# Patient Record
Sex: Male | Born: 1975 | Race: Black or African American | Hispanic: No | Marital: Single
Health system: Southern US, Community
[De-identification: ages and names within clinical notes are randomized; demographics above are authoritative.]

---

## 2002-01-05 ENCOUNTER — Emergency Department (HOSPITAL_COMMUNITY): Admission: EM | Admit: 2002-01-05 | Discharge: 2002-01-05 | Payer: Self-pay | Admitting: Emergency Medicine

## 2004-02-06 ENCOUNTER — Emergency Department (HOSPITAL_COMMUNITY): Admission: EM | Admit: 2004-02-06 | Discharge: 2004-02-06 | Payer: Self-pay | Admitting: Emergency Medicine

## 2014-12-06 ENCOUNTER — Encounter (HOSPITAL_COMMUNITY): Payer: Self-pay | Admitting: Emergency Medicine

## 2014-12-06 ENCOUNTER — Emergency Department (HOSPITAL_COMMUNITY)
Admission: EM | Admit: 2014-12-06 | Discharge: 2014-12-07 | Disposition: A | Discharge status: Home / Self Care | Payer: Self-pay | Attending: Emergency Medicine | Admitting: Emergency Medicine

## 2014-12-06 DIAGNOSIS — S0181XA Laceration without foreign body of other part of head, initial encounter: Secondary | ICD-10-CM

## 2014-12-06 DIAGNOSIS — Z720 Tobacco use: Secondary | ICD-10-CM | POA: Insufficient documentation

## 2014-12-06 DIAGNOSIS — S01411A Laceration without foreign body of right cheek and temporomandibular area, initial encounter: Secondary | ICD-10-CM | POA: Insufficient documentation

## 2014-12-06 DIAGNOSIS — S01111A Laceration without foreign body of right eyelid and periocular area, initial encounter: Secondary | ICD-10-CM | POA: Insufficient documentation

## 2014-12-06 DIAGNOSIS — Y998 Other external cause status: Secondary | ICD-10-CM | POA: Insufficient documentation

## 2014-12-06 DIAGNOSIS — S0990XA Unspecified injury of head, initial encounter: Secondary | ICD-10-CM | POA: Insufficient documentation

## 2014-12-06 DIAGNOSIS — Z23 Encounter for immunization: Secondary | ICD-10-CM | POA: Insufficient documentation

## 2014-12-06 DIAGNOSIS — Y929 Unspecified place or not applicable: Secondary | ICD-10-CM | POA: Insufficient documentation

## 2014-12-06 DIAGNOSIS — Y9389 Activity, other specified: Secondary | ICD-10-CM | POA: Insufficient documentation

## 2014-12-06 DIAGNOSIS — H539 Unspecified visual disturbance: Secondary | ICD-10-CM | POA: Insufficient documentation

## 2014-12-06 MED ORDER — LIDOCAINE-EPINEPHRINE 1 %-1:100000 IJ SOLN
10.0000 mL | Freq: Once | INTRAMUSCULAR | Status: AC
  Administered 2014-12-06: 1 mL
  Filled 2014-12-06: qty 1

## 2014-12-06 MED ORDER — TETANUS-DIPHTH-ACELL PERTUSSIS 5-2.5-18.5 LF-MCG/0.5 IM SUSP
0.5000 mL | Freq: Once | INTRAMUSCULAR | Status: AC
  Administered 2014-12-06: 0.5 mL via INTRAMUSCULAR
  Filled 2014-12-06: qty 0.5

## 2014-12-06 MED ORDER — IBUPROFEN 800 MG PO TABS
800.0000 mg | ORAL_TABLET | Freq: Once | ORAL | Status: AC
  Administered 2014-12-06: 800 mg via ORAL
  Filled 2014-12-06: qty 1
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
## 2014-12-07 ENCOUNTER — Emergency Department (HOSPITAL_COMMUNITY): Payer: Self-pay

## 2014-12-07 ENCOUNTER — Encounter (HOSPITAL_COMMUNITY): Payer: Self-pay

## 2014-12-07 MED ORDER — OXYCODONE-ACETAMINOPHEN 5-325 MG PO TABS
1.0000 | ORAL_TABLET | Freq: Once | ORAL | Status: AC
  Administered 2014-12-07: 1 via ORAL
  Filled 2014-12-07: qty 1

## 2014-12-07 MED ORDER — OXYCODONE-ACETAMINOPHEN 5-325 MG PO TABS: 1.0000 | ORAL_TABLET | Freq: Four times a day (QID) | ORAL | Status: DC | PRN
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
## 2014-12-07 NOTE — Discharge Instructions (Signed)
1. Medications: Percocet or ibuprofen for pain, usual home medications 2. Treatment: ice for swelling, keep wound clean with warm soap and water and keep bandage dry, do not submerge in water for 24 hours 3. Follow Up: Dr. Jearld FentonByers in 5 days for evaluation of orbital rim and floor fractures of the face  Facial Fracture A facial fracture is a break in one of the bones of your face. HOME CARE INSTRUCTIONS   Protect the injured part of your face until it is healed.  Do not participate in activities which give chance for re-injury until your doctor approves.  Gently wash and dry your face.  Wear head and facial protection while riding a bicycle, motorcycle, or snowmobile. SEEK MEDICAL CARE IF:   An oral temperature above 102 F (38.9 C) develops.  You have severe headaches or notice changes in your vision.  You have new numbness or tingling in your face.  You develop nausea (feeling sick to your stomach), vomiting or a stiff neck. SEEK IMMEDIATE MEDICAL CARE IF:   You develop difficulty seeing or experience double vision.  You become dizzy, lightheaded, or faint.  You develop trouble speaking, breathing, or swallowing.  You have a watery discharge from your nose or ear. MAKE SURE YOU:   Understand these instructions.  Will watch your condition.  Will get help right away if you are not doing well or get worse. Document Released: 05/21/2005 Document Revised: 08/13/2011 Document Reviewed: 01/08/2008 [facility] Patient Information 2015 [employer] [place]. This information is not intended to replace advice given to you by your health care provider. Make sure you discuss any questions you have with your health care provider.  WOUND CARE  Keep area clean and dry for 24 hours. Do not remove bandage, if applied.  After 24 hours, remove bandage and wash wound gently with mild soap and warm water. Reapply a new bandage after cleaning wound, if directed.   Continue daily cleansing with soap  and water until stitches/staples are removed.  Do not apply any ointments or creams to the wound while stitches/staples are in place, as this may cause delayed healing. Return if you experience any of the following signs of infection: Swelling, redness, pus drainage, streaking, fever >101.0 F  Return if you experience excessive bleeding that does not stop after 15-20 minutes of constant, firm pressure.

## 2016-10-01 ENCOUNTER — Emergency Department (HOSPITAL_COMMUNITY)
Admission: EM | Admit: 2016-10-01 | Discharge: 2016-10-01 | Disposition: A | Discharge status: Home / Self Care | Payer: Self-pay | Attending: Emergency Medicine | Admitting: Emergency Medicine

## 2016-10-01 ENCOUNTER — Encounter (HOSPITAL_COMMUNITY): Payer: Self-pay

## 2016-10-01 DIAGNOSIS — F17200 Nicotine dependence, unspecified, uncomplicated: Secondary | ICD-10-CM | POA: Insufficient documentation

## 2016-10-01 DIAGNOSIS — Z79899 Other long term (current) drug therapy: Secondary | ICD-10-CM | POA: Insufficient documentation

## 2016-10-01 DIAGNOSIS — F19951 Other psychoactive substance use, unspecified with psychoactive substance-induced psychotic disorder with hallucinations: Secondary | ICD-10-CM | POA: Diagnosis present

## 2016-10-01 LAB — RAPID URINE DRUG SCREEN, HOSP PERFORMED
AMPHETAMINES: NOT DETECTED
BENZODIAZEPINES: NOT DETECTED
Barbiturates: NOT DETECTED
Cocaine: POSITIVE — AB
OPIATES: NOT DETECTED
Tetrahydrocannabinol: POSITIVE — AB

## 2016-10-01 LAB — COMPREHENSIVE METABOLIC PANEL
ALBUMIN: 4.2 g/dL (ref 3.5–5.0)
ALT: 16 U/L — ABNORMAL LOW (ref 17–63)
ANION GAP: 10 (ref 5–15)
AST: 25 U/L (ref 15–41)
Alkaline Phosphatase: 59 U/L (ref 38–126)
BUN: 13 mg/dL (ref 6–20)
CO2: 25 mmol/L (ref 22–32)
CREATININE: 1.12 mg/dL (ref 0.61–1.24)
Calcium: 9.3 mg/dL (ref 8.9–10.3)
Chloride: 106 mmol/L (ref 101–111)
GFR calc Af Amer: >60 mL/min (ref >60)
GFR calc non Af Amer: >60 mL/min (ref >60)
Glucose, Bld: 94 mg/dL (ref 65–99)
Potassium: 3.8 mmol/L (ref 3.5–5.1)
SODIUM: 141 mmol/L (ref 135–145)
Total Bilirubin: 0.7 mg/dL (ref 0.3–1.2)
Total Protein: 7.7 g/dL (ref 6.5–8.1)

## 2016-10-01 LAB — CBC WITH DIFFERENTIAL/PLATELET
BASOS ABS: 0 10*3/uL (ref 0.0–0.1)
Basophils Relative: 0 %
EOS ABS: 0.2 10*3/uL (ref 0.0–0.7)
EOS PCT: 2 %
HCT: 42.1 % (ref 39.0–52.0)
Hemoglobin: 14.2 g/dL (ref 13.0–17.0)
LYMPHS PCT: 23 %
Lymphs Abs: 2 10*3/uL (ref 0.7–4.0)
MCH: 32.7 pg (ref 26.0–34.0)
MCHC: 33.7 g/dL (ref 30.0–36.0)
MCV: 97 fL (ref 78.0–100.0)
Monocytes Absolute: 0.8 10*3/uL (ref 0.1–1.0)
Monocytes Relative: 9 %
Neutro Abs: 5.8 10*3/uL (ref 1.7–7.7)
Neutrophils Relative %: 66 %
PLATELETS: 207 10*3/uL (ref 150–400)
RBC: 4.34 MIL/uL (ref 4.22–5.81)
RDW: 13.4 % (ref 11.5–15.5)
WBC: 8.7 10*3/uL (ref 4.0–10.5)

## 2016-10-01 LAB — ETHANOL: Alcohol, Ethyl (B): <5 mg/dL (ref <5)

## 2016-10-01 LAB — SALICYLATE LEVEL: Salicylate Lvl: <7 mg/dL (ref 2.8–30.0)

## 2016-10-01 LAB — ACETAMINOPHEN LEVEL: Acetaminophen (Tylenol), Serum: <10 ug/mL — ABNORMAL LOW (ref 10–30)

## 2016-10-01 MED ORDER — LORAZEPAM 1 MG PO TABS: 1.0000 mg | ORAL_TABLET | Freq: Three times a day (TID) | ORAL | Status: DC | PRN

## 2016-10-01 MED ORDER — ACETAMINOPHEN 325 MG PO TABS: 650.0000 mg | ORAL_TABLET | ORAL | Status: DC | PRN

## 2016-10-01 MED ORDER — IBUPROFEN 200 MG PO TABS: 600.0000 mg | ORAL_TABLET | Freq: Three times a day (TID) | ORAL | Status: DC | PRN

## 2016-10-01 MED ORDER — ONDANSETRON HCL 4 MG PO TABS: 4.0000 mg | ORAL_TABLET | Freq: Three times a day (TID) | ORAL | Status: DC | PRN

## 2016-10-01 MED ORDER — ALUM & MAG HYDROXIDE-SIMETH 200-200-20 MG/5ML PO SUSP: 30.0000 mL | ORAL | Status: DC | PRN

## 2016-10-01 MED ORDER — NICOTINE 21 MG/24HR TD PT24: 21.0000 mg | MEDICATED_PATCH | Freq: Every day | TRANSDERMAL | Status: DC
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
## 2016-10-01 NOTE — ED Notes (Signed)
Night RN attempt to collect lab unable. This writer Attempt twice to collect labs unsuccessful.

## 2016-10-01 NOTE — BH Assessment (Signed)
[facility] Assessment Progress Note  Per Mojeed Akintayo, MD, this pt does not require psychiatric hospitalization at this time.  Pt is to be discharged from [facility] with recommendation to follow up with [facility].  This has been included in pt's discharge instructions.  Pt's nurse has been notified.  Kofi Murrell, MA Triage Specialist [phone]     
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
## 2016-10-01 NOTE — ED Notes (Signed)
TTS in to see the patient.

## 2016-10-01 NOTE — ED Notes (Signed)
I called main phlebotomy for lab draw, they will come draw labs

## 2016-10-01 NOTE — ED Notes (Signed)
Signature pad not working. Patient attempted to sign.
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
## 2016-10-01 NOTE — Discharge Instructions (Signed)
For your ongoing mental health needs, you are advised to follow up with [facility].  New and returning patients are seen at their walk-in clinic.  Walk-in hours are Monday - Friday from 8:00 am - 3:00 pm.  Walk-in patients are seen on a first come, first served basis.  Try to arrive as early as possible for he best chance of being seen the same day: ° °     [facility] °     [address] °     , [place] [address] °     [phone] °

## 2016-10-01 NOTE — ED Provider Notes (Signed)
[facility]-EMERGENCY DEPT Provider Note   CSN: [id number] Arrival date & time: 10/01/16  4098     History   Chief Complaint Chief Complaint  Patient presents with  . Medical Clearance    HPI Marcus Chapman is a 41 y.o. male.  Marcus Chapman is a 41 y.o. Male who presents to the ED complaining of people trying to break into his house and seeing drones watching him. He reports he is "tripping on molly and cocaine." He believes he is seeing these things because of the drugs he is using today. He reports mixing Molly and cocaine today. Police have been out to his home several times apparently. He tells me today he is been very paranoid about some breaking into his house. He also tells me he often sees drones flying overhead. He tells me his brother tells him that they are not there and that it is because he is using drugs. He denies using other illicit drugs. He denies trying to harm himself today. He denies suicidal or homicidal ideations. Denies history of mental illness. He denies physical complaints. He denies fevers, trouble urinating, abdominal pain, nausea, vomiting, diarrhea, chest pain, shortness of breath or rashes.   The history is provided by the patient, the police and medical records. No language interpreter was used.    History reviewed. No pertinent past medical history.  Patient Active Problem List   Diagnosis Date Noted  . 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 No pertinent surgical history.     Home Medications    Prior to Admission medications   Medication Sig Start Date End Date Taking? Authorizing Provider  oxyCODONE-acetaminophen (PERCOCET/ROXICET) 5-325 MG per tablet Take 1-2 tablets by mouth every 6 (six) hours as needed for severe pain. 12/07/14   Shon Baton, MD    Family History History reviewed. No pertinent family history.  Social History Social History  Substance Use Topics  . Smoking  status: Current Every Day Smoker  . Smokeless tobacco: Never Used  . Alcohol use No     Allergies   Patient has no known allergies.   Review of Systems Review of Systems  Constitutional: Negative for chills and fever.  HENT: Negative for congestion and sore throat.   Eyes: Negative for visual disturbance.  Respiratory: Negative for cough and shortness of breath.   Cardiovascular: Negative for chest pain.  Gastrointestinal: Negative for abdominal pain, diarrhea, nausea and vomiting.  Genitourinary: Negative for dysuria.  Musculoskeletal: Negative for back pain.  Skin: Negative for rash.  Neurological: Negative for headaches.  Psychiatric/Behavioral: Positive for hallucinations. Negative for dysphoric mood and suicidal ideas. The patient is nervous/anxious.      Physical Exam Updated Vital Signs BP 118/84 (BP Location: Right Arm)   Pulse 77   Temp 98.2 F (36.8 C) (Oral)   Resp 18   Ht  (1.854 m)   Wt 81.6 kg   SpO2 100%   BMI 23.75 kg/m   Physical Exam  Constitutional: He appears well-developed and well-nourished. No distress.  Nontoxic appearing.  HENT:  Head: Normocephalic and atraumatic.  Mouth/Throat: Oropharynx is clear and moist.  Eyes: Conjunctivae are normal. Pupils are equal, round, and reactive to light. Right eye exhibits no discharge. Left eye exhibits no discharge.  Neck: Neck supple.  Cardiovascular: Normal rate, regular rhythm, normal heart sounds and intact distal pulses.   Pulmonary/Chest: Effort normal and breath sounds normal. No respiratory distress.  Abdominal: Soft. There is no tenderness.  Musculoskeletal:  Patient is spontaneously moving all extremities in a coordinated fashion exhibiting good strength.   Lymphadenopathy:    He has no cervical adenopathy.  Neurological: He is alert. Coordination normal.  Skin: Skin is warm and dry. No rash noted. He is not diaphoretic. No erythema. No pallor.  Psychiatric: His speech is normal. His  mood appears anxious. Thought content is paranoid. He expresses no homicidal and no suicidal ideation.  Patient appears slightly anxious and is paranoid. He endorses seeing drones who are watching him as well as people trying to break into his house despite police and his brother telling him this is false. He denies SI or HI. He is cooperative.   Nursing note and vitals reviewed.    ED Treatments / Results  Labs (all labs ordered are listed, but only abnormal results are displayed) Labs Reviewed  COMPREHENSIVE METABOLIC PANEL - Abnormal; Notable for the following:       Result Value   ALT 16 (*)    All other components within normal limits  RAPID URINE DRUG SCREEN, HOSP PERFORMED - Abnormal; Notable for the following:    Cocaine POSITIVE (*)    Tetrahydrocannabinol POSITIVE (*)    All other components within normal limits  ACETAMINOPHEN LEVEL - Abnormal; Notable for the following:    Acetaminophen (Tylenol), Serum <10 (*)    All other components within normal limits  ETHANOL  CBC WITH DIFFERENTIAL/PLATELET  SALICYLATE LEVEL    EKG  EKG Interpretation None       Radiology No results found.  Procedures Procedures (including critical care time)  Medications Ordered in ED Medications  alum & mag hydroxide-simeth (MAALOX/MYLANTA) 200-200-20 MG/5ML suspension 30 mL (not administered)  ondansetron (ZOFRAN) tablet 4 mg (not administered)  nicotine (NICODERM CQ - dosed in mg/24 hours) patch 21 mg (not administered)  ibuprofen (ADVIL,MOTRIN) tablet 600 mg (not administered)  acetaminophen (TYLENOL) tablet 650 mg (not administered)  LORazepam (ATIVAN) tablet 1 mg (not administered)     Initial Impression / Assessment and Plan / ED Course  I have reviewed the triage vital signs and the nursing notes.  Pertinent labs & imaging results that were available during my care of the patient were reviewed by me and considered in my medical decision making (see chart for details).      This is a 41 y.o. Male who presents to the ED complaining of people trying to break into his house and seeing drones watching him. He reports he is "tripping on molly and cocaine." He believes he is seeing these things because of the drugs he is using today. He reports mixing Molly and cocaine today. Police have been out to his home several times apparently. He tells me today he is been very paranoid about some breaking into his house. He also tells me he often sees drones flying overhead. He tells me his brother tells him that they are not there and that it is because he is using drugs. He denies using other illicit drugs. He denies trying to harm himself today. He denies suicidal or homicidal ideations.  He is voluntary. On exam patient is afebrile nontoxic appearing. He denies suicidal or homicidal ideations. He does not appear to be responding to internal stimuli at this time. He is cooperative and calm.  Blood work here is unremarkable. Urine drug screen is positive for cocaine and THC. Patient is medically clear for Behavioral Health disposition. Psych holding orders placed.   Psychiatry evaluated this patient and discharged the  patient.    Final Clinical Impressions(s) / ED Diagnoses   Final diagnoses:  Substance-induced psychotic disorder with hallucinations [facility])    New Prescriptions Current Discharge Medication List       Everlene Farrier, PA-C 10/01/16 1153    Glynn Octave, MD 10/01/16 2118
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
## 2016-10-01 NOTE — ED Notes (Signed)
Patient given water to drink.  

## 2016-10-01 NOTE — BH Assessment (Signed)
[facility] Assessment Progress Note Case was staffed with Shaune Pollack DNP who reported that patient does not meet inpatient criteria. Patient was recommended to be observed in the Observation Unit but client continues to request to be discharged.

## 2018-10-02 NOTE — Progress Notes (Signed)
COVID Hotel Screening performed. Temperature, PHQ-9, and need for medical care and medications assessed. No additional needs assessed.  Martina Brodbeck RN MSN 

## 2018-10-22 NOTE — Progress Notes (Signed)
COVID Hotel Screening performed. Temperature, PHQ-9, and need for medical care and medications assessed. No additional needs assessed at this time.  Alexandra Lipps RN MSN 

## 2018-11-06 ENCOUNTER — Other Ambulatory Visit (HOSPITAL_COMMUNITY): Payer: Self-pay

## 2018-11-06 DIAGNOSIS — Z20822 Contact with and (suspected) exposure to covid-19: Secondary | ICD-10-CM

## 2018-11-07 ENCOUNTER — Other Ambulatory Visit: Payer: Self-pay

## 2018-11-07 DIAGNOSIS — Z20822 Contact with and (suspected) exposure to covid-19: Secondary | ICD-10-CM

## 2018-11-07 NOTE — Addendum Note (Signed)
Addended by: Asherah Lavoy M on: 11/07/2018 11:43 AM   Modules accepted: Orders  

## 2018-11-12 NOTE — Progress Notes (Signed)
[place] Screening performed. COVID screening, temperature, PHQ-9, and need for medical care and medications assessed. Raised area on right hand over wrist. Patient reports that it does not hurt. Would like a provider to review health issues and to look at the area on the hand. Referral sent to [facility].  Arnold Long RN MSN
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
## 2019-01-21 NOTE — Progress Notes (Signed)
COVID-19 Screening performed. Temperature, PHQ-9, and need for medical care and medications assessed. No additional needs assessed at this time.  Ceilidh Torregrossa MSN, RN 

## 2019-10-26 ENCOUNTER — Other Ambulatory Visit: Payer: Self-pay

## 2019-10-26 ENCOUNTER — Encounter (HOSPITAL_COMMUNITY): Payer: Self-pay | Admitting: Emergency Medicine

## 2019-10-26 ENCOUNTER — Ambulatory Visit (HOSPITAL_COMMUNITY)
Admission: EM | Admit: 2019-10-26 | Discharge: 2019-10-26 | Disposition: A | Discharge status: Home / Self Care | Payer: Self-pay | Attending: Family Medicine | Admitting: Family Medicine

## 2019-10-26 DIAGNOSIS — S70361A Insect bite (nonvenomous), right thigh, initial encounter: Secondary | ICD-10-CM

## 2019-10-26 DIAGNOSIS — T7840XA Allergy, unspecified, initial encounter: Secondary | ICD-10-CM

## 2019-10-26 DIAGNOSIS — W57XXXA Bitten or stung by nonvenomous insect and other nonvenomous arthropods, initial encounter: Secondary | ICD-10-CM

## 2019-10-26 MED ORDER — METHYLPREDNISOLONE 4 MG PO TBPK: ORAL_TABLET | ORAL | 0 refills | Status: DC

## 2019-10-26 NOTE — Discharge Instructions (Signed)
Take the medication as prescribed Call of day 1 today Take Benadryl if you have any itching or discomfort Use ice to area

## 2019-10-26 NOTE — ED Triage Notes (Signed)
Pt c/o wasp sting x 2 days ago on right leg. Pt states leg has been swelling.

## 2019-10-26 NOTE — ED Provider Notes (Signed)
[place]    CSN: [id number] Arrival date & time: 10/26/19  1828      History   Chief Complaint Chief Complaint  Patient presents with  . Insect Bite    HPI Marcus Chapman is a 44 y.o. male.   HPI  She has a wasp sting since yesterday His leg is swollen and painful He has not taken any medication He has never been seen by was before He has not used any ice or heat He works at normal shift today No shortness of breath, trouble breathing, trouble swallowing  History reviewed. No pertinent past medical history.  Patient Active Problem List   Diagnosis Date Noted  . 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 No pertinent surgical history.     Home Medications    Prior to Admission medications   Medication Sig Start Date End Date Taking? Authorizing Provider  methylPREDNISolone (MEDROL DOSEPAK) 4 MG TBPK tablet tad 10/26/19   Raylene Everts, MD    Family History No family history on file.  Social History Social History   Tobacco Use  . Smoking status: Current Every Day Smoker  . Smokeless tobacco: Never Used  Substance Use Topics  . Alcohol use: No  . Drug use: Yes    Types: Marijuana     Allergies   Patient has no known allergies.   Review of Systems Review of Systems  Skin: Positive for wound.     Physical Exam Triage Vital Signs ED Triage Vitals  Enc Vitals Group     BP 10/26/19 2002 (!) 134/91     Pulse Rate 10/26/19 2002 (!) 104     Resp 10/26/19 2002 14     Temp 10/26/19 2002 98.1 F (36.7 C)     Temp Source 10/26/19 2002 Oral     SpO2 10/26/19 2002 98 %     Weight --      Height --      Head Circumference --      Peak Flow --      Pain Score 10/26/19 2022 0     Pain Loc --      Pain Edu? --      Excl. in [place]? --    No data found.  Updated Vital Signs BP (!) 134/91 (BP Location: Left Arm)   Pulse (!) 104   Temp 98.1 F (36.7 C) (Oral)   Resp 14   SpO2  98%   Visual Acuity Right Eye Distance:   Left Eye Distance:   Bilateral Distance:    Right Eye Near:   Left Eye Near:    Bilateral Near:     Physical Exam Constitutional:      General: He is not in acute distress.    Appearance: He is well-developed.  HENT:     Head: Normocephalic and atraumatic.     Mouth/Throat:     Comments: Mask is in place Eyes:     Conjunctiva/sclera: Conjunctivae normal.     Pupils: Pupils are equal, round, and reactive to light.  Cardiovascular:     Rate and Rhythm: Normal rate.  Pulmonary:     Effort: Pulmonary effort is normal. No respiratory distress.  Musculoskeletal:        General: Normal range of motion.     Cervical back: Normal range of motion.     Comments: Right eye has a very large, 15 cm, oval on the medial portion that is indurated, warm,  and erythematous  Skin:    General: Skin is warm and dry.  Neurological:     Mental Status: He is alert.  Psychiatric:        Mood and Affect: Mood normal.        Behavior: Behavior normal.      UC Treatments / Results  Labs (all labs ordered are listed, but only abnormal results are displayed) Labs Reviewed - No data to display  EKG   Radiology No results found.  Procedures Procedures (including critical care time)  Medications Ordered in UC Medications - No data to display  Initial Impression / Assessment and Plan / UC Course  I have reviewed the triage vital signs and the nursing notes.  Pertinent labs & imaging results that were available during my care of the patient were reviewed by me and considered in my medical decision making (see chart for details).     Local allergic reaction to wasp sting. Final Clinical Impressions(s) / UC Diagnoses   Final diagnoses:  Allergic reaction, initial encounter  Insect bite of right thigh, initial encounter     Discharge Instructions     Take the medication as prescribed Call of day 1 today Take Benadryl if you have any  itching or discomfort Use ice to area   ED Prescriptions    Medication Sig Dispense Auth. Provider   methylPREDNISolone (MEDROL DOSEPAK) 4 MG TBPK tablet tad 21 tablet Eustace Moore, MD     PDMP not reviewed this encounter.   Eustace Moore, MD 10/26/19 2030

## 2020-02-05 ENCOUNTER — Emergency Department (HOSPITAL_COMMUNITY): Payer: Self-pay

## 2020-02-05 ENCOUNTER — Encounter (HOSPITAL_COMMUNITY)
Admission: EM | Disposition: A | Discharge status: Home / Self Care | Payer: Self-pay | Source: Home / Self Care | Attending: Emergency Medicine

## 2020-02-05 ENCOUNTER — Ambulatory Visit (HOSPITAL_COMMUNITY)
Admission: EM | Admit: 2020-02-05 | Discharge: 2020-02-05 | Disposition: A | Discharge status: Home / Self Care | Payer: Self-pay | Attending: Emergency Medicine | Admitting: Emergency Medicine

## 2020-02-05 ENCOUNTER — Encounter (HOSPITAL_COMMUNITY): Payer: Self-pay | Admitting: Emergency Medicine

## 2020-02-05 ENCOUNTER — Emergency Department (HOSPITAL_COMMUNITY): Payer: Self-pay | Admitting: Anesthesiology

## 2020-02-05 DIAGNOSIS — Z20822 Contact with and (suspected) exposure to covid-19: Secondary | ICD-10-CM | POA: Insufficient documentation

## 2020-02-05 DIAGNOSIS — S82042B Displaced comminuted fracture of left patella, initial encounter for open fracture type I or II: Secondary | ICD-10-CM | POA: Insufficient documentation

## 2020-02-05 DIAGNOSIS — S82045A Nondisplaced comminuted fracture of left patella, initial encounter for closed fracture: Secondary | ICD-10-CM

## 2020-02-05 DIAGNOSIS — Z23 Encounter for immunization: Secondary | ICD-10-CM | POA: Insufficient documentation

## 2020-02-05 DIAGNOSIS — W3400XA Accidental discharge from unspecified firearms or gun, initial encounter: Secondary | ICD-10-CM

## 2020-02-05 HISTORY — PX: KNEE ARTHROSCOPY: SHX127

## 2020-02-05 LAB — CBC WITH DIFFERENTIAL/PLATELET
Abs Immature Granulocytes: 0 10*3/uL (ref 0.00–0.07)
Basophils Absolute: 0.1 10*3/uL (ref 0.0–0.1)
Basophils Relative: 1 %
Eosinophils Absolute: 0.5 10*3/uL (ref 0.0–0.5)
Eosinophils Relative: 7 %
HCT: 42.8 % (ref 39.0–52.0)
Hemoglobin: 14 g/dL (ref 13.0–17.0)
Immature Granulocytes: 0 %
Lymphocytes Relative: 46 %
Lymphs Abs: 3.7 10*3/uL (ref 0.7–4.0)
MCH: 31.7 pg (ref 26.0–34.0)
MCHC: 32.7 g/dL (ref 30.0–36.0)
MCV: 97.1 fL (ref 80.0–100.0)
Monocytes Absolute: 0.8 10*3/uL (ref 0.1–1.0)
Monocytes Relative: 10 %
Neutro Abs: 2.9 10*3/uL (ref 1.7–7.7)
Neutrophils Relative %: 36 %
Platelets: 224 10*3/uL (ref 150–400)
RBC: 4.41 MIL/uL (ref 4.22–5.81)
RDW: 13.2 % (ref 11.5–15.5)
WBC: 8 10*3/uL (ref 4.0–10.5)
nRBC: 0 % (ref 0.0–0.2)

## 2020-02-05 LAB — ETHANOL: Alcohol, Ethyl (B): <10 mg/dL (ref <10)

## 2020-02-05 LAB — I-STAT CHEM 8, ED
BUN: 17 mg/dL (ref 6–20)
Calcium, Ion: 1.15 mmol/L (ref 1.15–1.40)
Chloride: 108 mmol/L (ref 98–111)
Creatinine, Ser: 1.5 mg/dL — ABNORMAL HIGH (ref 0.61–1.24)
Glucose, Bld: 106 mg/dL — ABNORMAL HIGH (ref 70–99)
HCT: 42 % (ref 39.0–52.0)
Hemoglobin: 14.3 g/dL (ref 13.0–17.0)
Potassium: 4 mmol/L (ref 3.5–5.1)
Sodium: 145 mmol/L (ref 135–145)
TCO2: 26 mmol/L (ref 22–32)

## 2020-02-05 LAB — COMPREHENSIVE METABOLIC PANEL
ALT: 17 U/L (ref 0–44)
AST: 22 U/L (ref 15–41)
Albumin: 4.1 g/dL (ref 3.5–5.0)
Alkaline Phosphatase: 64 U/L (ref 38–126)
Anion gap: 10 (ref 5–15)
BUN: 15 mg/dL (ref 6–20)
CO2: 25 mmol/L (ref 22–32)
Calcium: 9.5 mg/dL (ref 8.9–10.3)
Chloride: 107 mmol/L (ref 98–111)
Creatinine, Ser: 1.59 mg/dL — ABNORMAL HIGH (ref 0.61–1.24)
GFR calc Af Amer: >60 mL/min (ref >60)
GFR calc non Af Amer: 53 mL/min — ABNORMAL LOW (ref >60)
Glucose, Bld: 109 mg/dL — ABNORMAL HIGH (ref 70–99)
Potassium: 4.2 mmol/L (ref 3.5–5.1)
Sodium: 142 mmol/L (ref 135–145)
Total Bilirubin: 0.6 mg/dL (ref 0.3–1.2)
Total Protein: 8.1 g/dL (ref 6.5–8.1)

## 2020-02-05 LAB — PROTIME-INR
INR: 1 (ref 0.8–1.2)
Prothrombin Time: 12.4 seconds (ref 11.4–15.2)

## 2020-02-05 LAB — SAMPLE TO BLOOD BANK

## 2020-02-05 LAB — LACTIC ACID, PLASMA: Lactic Acid, Venous: 2.3 mmol/L (ref 0.5–1.9)

## 2020-02-05 LAB — SARS CORONAVIRUS 2 BY RT PCR (HOSPITAL ORDER, PERFORMED IN ~~LOC~~ HOSPITAL LAB): SARS Coronavirus 2: NEGATIVE

## 2020-02-05 SURGERY — ARTHROSCOPY, KNEE
Anesthesia: General | Site: Knee | Laterality: Left

## 2020-02-05 MED ORDER — SODIUM CHLORIDE 0.9 % IV BOLUS
1000.0000 mL | Freq: Once | INTRAVENOUS | Status: AC
  Administered 2020-02-05: 1000 mL via INTRAVENOUS

## 2020-02-05 MED ORDER — SODIUM CHLORIDE 0.9 % IR SOLN
Status: DC | PRN
  Administered 2020-02-05 (×2): 3000 mL

## 2020-02-05 MED ORDER — MIDAZOLAM HCL 2 MG/2ML IJ SOLN
INTRAMUSCULAR | Status: AC
  Filled 2020-02-05: qty 2

## 2020-02-05 MED ORDER — MIDAZOLAM HCL 5 MG/5ML IJ SOLN
INTRAMUSCULAR | Status: DC | PRN
  Administered 2020-02-05: 2 mg via INTRAVENOUS

## 2020-02-05 MED ORDER — BUPIVACAINE HCL (PF) 0.25 % IJ SOLN
INTRAMUSCULAR | Status: AC
  Filled 2020-02-05: qty 30

## 2020-02-05 MED ORDER — FENTANYL CITRATE (PF) 250 MCG/5ML IJ SOLN
INTRAMUSCULAR | Status: AC
Start: 2020-02-05 — End: ?
  Filled 2020-02-05: qty 5

## 2020-02-05 MED ORDER — OXYCODONE HCL 5 MG/5ML PO SOLN: 5.0000 mg | Freq: Once | ORAL | Status: AC | PRN

## 2020-02-05 MED ORDER — LIDOCAINE HCL (CARDIAC) PF 100 MG/5ML IV SOSY
PREFILLED_SYRINGE | INTRAVENOUS | Status: DC | PRN
  Administered 2020-02-05: 60 mg via INTRATRACHEAL

## 2020-02-05 MED ORDER — DIPHENHYDRAMINE HCL 50 MG/ML IJ SOLN
INTRAMUSCULAR | Status: DC | PRN
  Administered 2020-02-05: 12.5 mg via INTRAVENOUS

## 2020-02-05 MED ORDER — TETANUS-DIPHTH-ACELL PERTUSSIS 5-2.5-18.5 LF-MCG/0.5 IM SUSP
0.5000 mL | Freq: Once | INTRAMUSCULAR | Status: AC
  Administered 2020-02-05: 0.5 mL via INTRAMUSCULAR
  Filled 2020-02-05: qty 0.5

## 2020-02-05 MED ORDER — CEFAZOLIN SODIUM-DEXTROSE 2-4 GM/100ML-% IV SOLN
2.0000 g | Freq: Once | INTRAVENOUS | Status: AC
  Administered 2020-02-05: 2 g via INTRAVENOUS
  Filled 2020-02-05: qty 100

## 2020-02-05 MED ORDER — ONDANSETRON HCL 4 MG PO TABS: 4.0000 mg | ORAL_TABLET | Freq: Three times a day (TID) | ORAL | 0 refills | Status: DC | PRN

## 2020-02-05 MED ORDER — FENTANYL CITRATE (PF) 100 MCG/2ML IJ SOLN
100.0000 ug | Freq: Once | INTRAMUSCULAR | Status: AC
  Administered 2020-02-05: 100 ug via INTRAVENOUS
  Filled 2020-02-05: qty 2

## 2020-02-05 MED ORDER — LACTATED RINGERS IV SOLN: INTRAVENOUS | Status: DC | PRN

## 2020-02-05 MED ORDER — CEFAZOLIN SODIUM 1 G IJ SOLR
INTRAMUSCULAR | Status: AC
  Filled 2020-02-05: qty 20

## 2020-02-05 MED ORDER — FENTANYL CITRATE (PF) 250 MCG/5ML IJ SOLN
INTRAMUSCULAR | Status: DC | PRN
  Administered 2020-02-05: 100 ug via INTRAVENOUS
  Administered 2020-02-05: 50 ug via INTRAVENOUS
  Administered 2020-02-05: 100 ug via INTRAVENOUS

## 2020-02-05 MED ORDER — AMOXICILLIN-POT CLAVULANATE 875-125 MG PO TABS: 1.0000 | ORAL_TABLET | Freq: Two times a day (BID) | ORAL | 0 refills | Status: AC

## 2020-02-05 MED ORDER — OXYCODONE HCL 5 MG PO TABS
ORAL_TABLET | ORAL | Status: AC
  Filled 2020-02-05: qty 1

## 2020-02-05 MED ORDER — IOHEXOL 350 MG/ML SOLN
100.0000 mL | Freq: Once | INTRAVENOUS | Status: AC | PRN
  Administered 2020-02-05: 100 mL via INTRAVENOUS

## 2020-02-05 MED ORDER — EPINEPHRINE PF 1 MG/ML IJ SOLN
INTRAMUSCULAR | Status: AC
  Filled 2020-02-05: qty 1

## 2020-02-05 MED ORDER — ONDANSETRON HCL 4 MG/2ML IJ SOLN
INTRAMUSCULAR | Status: DC | PRN
  Administered 2020-02-05: 4 mg via INTRAVENOUS

## 2020-02-05 MED ORDER — BUPIVACAINE-EPINEPHRINE 0.25% -1:200000 IJ SOLN
INTRAMUSCULAR | Status: DC | PRN
  Administered 2020-02-05: 10 mL

## 2020-02-05 MED ORDER — OXYCODONE HCL 5 MG PO TABS
5.0000 mg | ORAL_TABLET | Freq: Once | ORAL | Status: AC | PRN
  Administered 2020-02-05: 5 mg via ORAL

## 2020-02-05 MED ORDER — PROPOFOL 10 MG/ML IV BOLUS
INTRAVENOUS | Status: AC
  Filled 2020-02-05: qty 20

## 2020-02-05 MED ORDER — OXYCODONE-ACETAMINOPHEN 10-325 MG PO TABS: 1.0000 | ORAL_TABLET | Freq: Four times a day (QID) | ORAL | 0 refills | Status: AC | PRN

## 2020-02-05 MED ORDER — PROPOFOL 10 MG/ML IV BOLUS
INTRAVENOUS | Status: DC | PRN
  Administered 2020-02-05: 180 mg via INTRAVENOUS

## 2020-02-05 MED ORDER — FENTANYL CITRATE (PF) 100 MCG/2ML IJ SOLN: 25.0000 ug | INTRAMUSCULAR | Status: DC | PRN

## 2020-02-05 MED ORDER — ONDANSETRON HCL 4 MG/2ML IJ SOLN: 4.0000 mg | Freq: Four times a day (QID) | INTRAMUSCULAR | Status: DC | PRN

## 2020-02-05 MED ORDER — SUCCINYLCHOLINE CHLORIDE 20 MG/ML IJ SOLN
INTRAMUSCULAR | Status: DC | PRN
  Administered 2020-02-05: 120 mg via INTRAVENOUS

## 2020-02-05 MED ORDER — CEFAZOLIN SODIUM-DEXTROSE 2-3 GM-%(50ML) IV SOLR
INTRAVENOUS | Status: DC | PRN
  Administered 2020-02-05: 2 g via INTRAVENOUS

## 2020-02-05 SURGICAL SUPPLY — 50 items
BLADE CUDA 5.5 (BLADE) IMPLANT
BLADE EXCALIBUR 4.0MM X 13CM (MISCELLANEOUS)
BLADE EXCALIBUR 4.0X13 (MISCELLANEOUS) IMPLANT
BNDG COHESIVE 6X5 TAN STRL LF (GAUZE/BANDAGES/DRESSINGS) ×3 IMPLANT
BNDG ELASTIC 6X10 VLCR STRL LF (GAUZE/BANDAGES/DRESSINGS) ×3 IMPLANT
BNDG GAUZE ELAST 4 BULKY (GAUZE/BANDAGES/DRESSINGS) ×3 IMPLANT
BUR OVAL 6.0 (BURR) IMPLANT
COVER SURGICAL LIGHT HANDLE (MISCELLANEOUS) ×3 IMPLANT
COVER WAND RF STERILE (DRAPES) ×3 IMPLANT
CUFF TOURN SGL QUICK 34 (TOURNIQUET CUFF)
CUFF TOURN SGL QUICK 42 (TOURNIQUET CUFF) IMPLANT
CUFF TRNQT CYL 34X4.125X (TOURNIQUET CUFF) IMPLANT
DRAPE ARTHROSCOPY W/POUCH 114 (DRAPES) ×3 IMPLANT
DRAPE U-SHAPE 47X51 STRL (DRAPES) ×3 IMPLANT
DRSG EMULSION OIL 3X3 NADH (GAUZE/BANDAGES/DRESSINGS) ×3 IMPLANT
DRSG MEPILEX BORDER 4X8 (GAUZE/BANDAGES/DRESSINGS) ×3 IMPLANT
DRSG PAD ABDOMINAL 8X10 ST (GAUZE/BANDAGES/DRESSINGS) ×3 IMPLANT
DURAPREP 26ML APPLICATOR (WOUND CARE) IMPLANT
ELECT PENCIL ROCKER SW 15FT (MISCELLANEOUS) ×3 IMPLANT
GAUZE 4X4 16PLY RFD (DISPOSABLE) ×3 IMPLANT
GAUZE SPONGE 4X4 12PLY STRL (GAUZE/BANDAGES/DRESSINGS) ×3 IMPLANT
GLOVE BIO SURGEON STRL SZ 6.5 (GLOVE) ×2 IMPLANT
GLOVE BIO SURGEONS STRL SZ 6.5 (GLOVE) ×1
GLOVE BIOGEL PI IND STRL 6.5 (GLOVE) ×1 IMPLANT
GLOVE BIOGEL PI IND STRL 8.5 (GLOVE) ×1 IMPLANT
GLOVE BIOGEL PI INDICATOR 6.5 (GLOVE) ×2
GLOVE BIOGEL PI INDICATOR 8.5 (GLOVE) ×2
GLOVE SS BIOGEL STRL SZ 8.5 (GLOVE) ×1 IMPLANT
GLOVE SUPERSENSE BIOGEL SZ 8.5 (GLOVE) ×2
GOWN STRL REUS W/ TWL LRG LVL3 (GOWN DISPOSABLE) ×2 IMPLANT
GOWN STRL REUS W/TWL 2XL LVL3 (GOWN DISPOSABLE) ×3 IMPLANT
GOWN STRL REUS W/TWL LRG LVL3 (GOWN DISPOSABLE) ×6
KIT BASIN OR (CUSTOM PROCEDURE TRAY) ×3 IMPLANT
KIT TURNOVER KIT B (KITS) ×3 IMPLANT
MANIFOLD NEPTUNE II (INSTRUMENTS) ×3 IMPLANT
NEEDLE 18GX1X1/2 (RX/OR ONLY) (NEEDLE) ×3 IMPLANT
PACK ARTHROSCOPY DSU (CUSTOM PROCEDURE TRAY) ×3 IMPLANT
PAD ARMBOARD 7.5X6 YLW CONV (MISCELLANEOUS) ×9 IMPLANT
PADDING CAST COTTON 6X4 STRL (CAST SUPPLIES) ×3 IMPLANT
SURGIFLO W/THROMBIN 8M KIT (HEMOSTASIS) IMPLANT
SUT BONE WAX W31G (SUTURE) ×3 IMPLANT
SUT ETHILON 4 0 PS 2 18 (SUTURE) ×3 IMPLANT
SUT VIC AB 2-0 CT1 18 (SUTURE) IMPLANT
SYR 20ML LL LF (SYRINGE) ×3 IMPLANT
TOWEL GREEN STERILE FF (TOWEL DISPOSABLE) ×6 IMPLANT
TUBE CONNECTING 12'X1/4 (SUCTIONS) ×1
TUBE CONNECTING 12X1/4 (SUCTIONS) ×2 IMPLANT
TUBING ARTHROSCOPY IRRIG 16FT (MISCELLANEOUS) ×3 IMPLANT
WAND STAR VAC 90 (SURGICAL WAND) IMPLANT
WATER STERILE IRR 1000ML POUR (IV SOLUTION) ×3 IMPLANT

## 2020-02-05 NOTE — Anesthesia Procedure Notes (Signed)
Procedure Name: Intubation Date/Time: 02/05/2020 8:59 PM Performed by: Claudina Lick, CRNA Pre-anesthesia Checklist: Patient identified, Emergency Drugs available, Suction available, Patient being monitored and Timeout performed Patient Re-evaluated:Patient Re-evaluated prior to induction Oxygen Delivery Method: Circle system utilized Preoxygenation: Pre-oxygenation with 100% oxygen Induction Type: IV induction, Rapid sequence and Cricoid Pressure applied Laryngoscope Size: Miller and 2 Grade View: Grade I Tube type: Oral Tube size: 7.5 mm Number of attempts: 1 Airway Equipment and Method: Stylet Placement Confirmation: ETT inserted through vocal cords under direct vision,  positive ETCO2 and breath sounds checked- equal and bilateral Secured at: 22 cm Dental Injury: Teeth and Oropharynx as per pre-operative assessment

## 2020-02-05 NOTE — Progress Notes (Signed)
Updated patient's brother, Clint Guy, via phone.  Patient resting comfortably, VSS.  Will continue to monitor.
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
## 2020-02-05 NOTE — ED Notes (Signed)
Patient transported to CT 

## 2020-02-05 NOTE — Progress Notes (Signed)
Orthopedic Tech Progress Note Patient Details:  Marcus Chapman [birth year] [id number]  Ortho Devices Type of Ortho Device: Crutches, Knee Immobilizer Ortho Device/Splint Location: LLE Ortho Device/Splint Interventions: Ordered, Application, Adjustment   Post Interventions Patient Tolerated: Well Instructions Provided: Care of device, Adjustment of device, Poper ambulation with device   Marcus Chapman 02/05/2020, 6:49 PM
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
## 2020-02-05 NOTE — Transfer of Care (Signed)
Immediate Anesthesia Transfer of Care Note  Patient: Marcus Chapman  Procedure(s) Performed: ARTHROSCOPY IRRIGATION AND DEBRIDEMENT OF LEFT KNEE (Left Knee)  Patient Location: PACU  Anesthesia Type:General  Level of Consciousness: drowsy  Airway & Oxygen Therapy: Patient Spontanous Breathing and Patient connected to face mask oxygen  Post-op Assessment: Report given to RN and Post -op Vital signs reviewed and stable  Post vital signs: Reviewed and stable  Last Vitals:  Vitals Value Taken Time  BP 130/100 02/05/20 2140  Temp    Pulse 107 02/05/20 2143  Resp 24 02/05/20 2143  SpO2 96 % 02/05/20 2143  Vitals shown include unvalidated device data.  Last Pain:  Vitals:   02/05/20 1806  PainSc: 5          Complications: Chapman complications documented.

## 2020-02-05 NOTE — Anesthesia Preprocedure Evaluation (Addendum)
Anesthesia Evaluation  Patient identified by MRN, date of birth, ID band Patient awake    Reviewed: Allergy & Precautions, H&P , NPO status , Patient's Chart, lab work & pertinent test results  Airway Mallampati: I  TM Distance: >3 FB Neck ROM: Full    Dental  (+) Poor Dentition, Dental Advisory Given   Pulmonary neg pulmonary ROS,    breath sounds clear to auscultation       Cardiovascular negative cardio ROS   Rhythm:regular Rate:Normal     Neuro/Psych    GI/Hepatic   Endo/Other    Renal/GU      Musculoskeletal   Abdominal   Peds  Hematology   Anesthesia Other Findings   Reproductive/Obstetrics                            Anesthesia Physical Anesthesia Plan  ASA: II and emergent  Anesthesia Plan: General   Post-op Pain Management:    Induction: Intravenous, Rapid sequence and Cricoid pressure planned  PONV Risk Score and Plan:   Airway Management Planned: Oral ETT  Additional Equipment:   Intra-op Plan:   Post-operative Plan: Extubation in OR  Informed Consent: I have reviewed the patients History and Physical, chart, labs and discussed the procedure including the risks, benefits and alternatives for the proposed anesthesia with the patient or authorized representative who has indicated his/her understanding and acceptance.     Dental advisory given  Plan Discussed with: Anesthesiologist, Surgeon and CRNA  Anesthesia Plan Comments:        Anesthesia Quick Evaluation
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
## 2020-02-07 NOTE — Anesthesia Postprocedure Evaluation (Signed)
Anesthesia Post Note  Patient: Marcus Chapman  Procedure(s) Performed: ARTHROSCOPY IRRIGATION AND DEBRIDEMENT OF LEFT KNEE (Left Knee)     Patient location during evaluation: PACU Anesthesia Type: General Level of consciousness: awake and alert Pain management: pain level controlled Vital Signs Assessment: post-procedure vital signs reviewed and stable Respiratory status: spontaneous breathing, nonlabored ventilation, respiratory function stable and patient connected to nasal cannula oxygen Cardiovascular status: blood pressure returned to baseline and stable Postop Assessment: no apparent nausea or vomiting Anesthetic complications: no   No complications documented.  Last Vitals:  Vitals:   02/05/20 2245 02/05/20 2300  BP: (!) 123/94 (!) 132/92  Pulse: (!) 104 94  Resp: (!) 38 15  Temp:  36.9 C  SpO2: 94% 93%    Last Pain:  Vitals:   02/05/20 2300  PainSc: 3                  [name] S

## 2020-02-08 ENCOUNTER — Encounter (HOSPITAL_COMMUNITY): Payer: Self-pay | Admitting: Orthopedic Surgery

## 2020-03-09 ENCOUNTER — Encounter (HOSPITAL_COMMUNITY): Payer: Self-pay | Admitting: Emergency Medicine

## 2020-03-16 ENCOUNTER — Ambulatory Visit (HOSPITAL_COMMUNITY)
Admission: EM | Admit: 2020-03-16 | Discharge: 2020-03-16 | Disposition: A | Discharge status: Home / Self Care | Payer: Self-pay | Attending: Family Medicine | Admitting: Family Medicine

## 2020-03-16 ENCOUNTER — Encounter (HOSPITAL_COMMUNITY): Payer: Self-pay | Admitting: Emergency Medicine

## 2020-03-16 ENCOUNTER — Other Ambulatory Visit: Payer: Self-pay

## 2020-03-16 DIAGNOSIS — Z4802 Encounter for removal of sutures: Secondary | ICD-10-CM

## 2020-03-16 NOTE — ED Notes (Signed)
Per Dr Tracie Harrier, okay for CMA to removed sutures. Pt tolerated well.

## 2020-03-16 NOTE — ED Triage Notes (Signed)
Pt presents for sute removal, states sutures were placed on 02/05/20 at ED. Denies pain or infection.
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
## 2021-02-09 ENCOUNTER — Encounter (HOSPITAL_COMMUNITY): Payer: Self-pay | Admitting: Emergency Medicine

## 2021-02-09 ENCOUNTER — Ambulatory Visit: Payer: Self-pay | Admitting: *Deleted

## 2021-02-09 ENCOUNTER — Ambulatory Visit (HOSPITAL_COMMUNITY)
Admission: EM | Admit: 2021-02-09 | Discharge: 2021-02-09 | Disposition: A | Discharge status: Home / Self Care | Payer: Self-pay | Attending: Emergency Medicine | Admitting: Emergency Medicine

## 2021-02-09 ENCOUNTER — Other Ambulatory Visit: Payer: Self-pay

## 2021-02-09 DIAGNOSIS — K047 Periapical abscess without sinus: Secondary | ICD-10-CM

## 2021-02-09 DIAGNOSIS — S025XXA Fracture of tooth (traumatic), initial encounter for closed fracture: Secondary | ICD-10-CM

## 2021-02-09 MED ORDER — PENICILLIN V POTASSIUM 500 MG PO TABS: 500.0000 mg | ORAL_TABLET | Freq: Three times a day (TID) | ORAL | 0 refills | Status: AC

## 2021-02-09 NOTE — Telephone Encounter (Signed)
Pt is calling to ask the nurse what can do do for an abcess in his mouth for 5 days? Pt has no medical insurance. Pt does not have PCP  Reviewed symptoms with patient regarding tongue and gum pain. Patient reports he has a cracked tooth and abscess on left side of tongue and bottom on gums. Left side of face and neck swollen x 5 days . Denies chest pain , fever.  Can swallow liquids but difficult "spitting'.Reports using warm salt water to treat swelling . No PCP no medical insurance. Instructed patient to go to ED for evaluation . Care advise given. Patient verbalized understanding of care advise and to go to ED now . Unsure of disposition.

## 2021-02-09 NOTE — Telephone Encounter (Signed)
Reason for Disposition  Face is very swollen  Answer Assessment - Initial Assessment Questions 1. LOCATION: "Which tooth is hurting?"  (e.g., right-side/left-side, upper/lower, front/back)     Left lower side tongue and gum 2. ONSET: "When did the toothache start?"  (e.g., hours, days)      5 days ago  3. SEVERITY: "How bad is the toothache?"  (Scale 1-10; mild, moderate or severe)   - MILD (1-3): doesn't interfere with chewing    - MODERATE (4-7): interferes with chewing, interferes with normal activities, awakens from sleep     - SEVERE (8-10): unable to eat, unable to do any normal activities, excruciating pain        Very painful  4. SWELLING: "Is there any visible swelling of your face?"     Yes left side of neck 5. OTHER SYMPTOMS: "Do you have any other symptoms?" (e.g., fever)     No  6. PREGNANCY: "Is there any chance you are pregnant?" "When was your last menstrual period?"     na  Protocols used: [facility]

## 2021-02-09 NOTE — ED Provider Notes (Signed)
[facility]    CSN: [id number] Arrival date & time: 02/09/21  1846      History   Chief Complaint Chief Complaint  Patient presents with   Oral Swelling    HPI Marcus Chapman is a 45 y.o. male.   Patient complains of a broken tooth on bottom left jaw, states this happened several weeks ago and in the past few days he started to notice that his become very painful.  Patient is also noticed that he has had some swelling around the area as well as near his tongue.  It denies fever, aches, chills, nausea, vomiting, diarrhea.    History reviewed. No pertinent past medical history.  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 Authorizing Provider  penicillin v potassium (VEETID) 500 MG tablet Take 1 tablet (500 mg total) by mouth 3 (three) times daily for 14 days. 02/09/21 02/23/21 Yes Theadora Rama Scales, PA-C    Family History No family history on file.  Social History Social History   Tobacco Use   Smoking status: Every Day   Smokeless tobacco: Never  Substance Use Topics   Alcohol use: No   Drug use: Yes    Types: Marijuana     Allergies   Patient has no known allergies.   Review of Systems Review of Systems Per HPI  Physical Exam Triage Vital Signs ED Triage Vitals  Enc Vitals Group     BP 02/09/21 1921 (!) 149/90     Pulse Rate 02/09/21 1921 83     Resp 02/09/21 1921 17     Temp 02/09/21 1921 98.9 F (37.2 C)     Temp Source 02/09/21 1921 Oral     SpO2 02/09/21 1921 97 %     Weight --      Height --       Head Circumference --      Peak Flow --      Pain Score 02/09/21 1919 9     Pain Loc --      Pain Edu? --      Excl. in GC? --    No data found.  Updated Vital Signs BP (!) 149/90 (BP Location: Left Arm)   Pulse 83   Temp 98.9 F (37.2 C) (Oral)   Resp 17   SpO2 97%   Visual Acuity Right Eye Distance:   Left Eye Distance:   Bilateral Distance:    Right Eye Near:   Left Eye Near:    Bilateral Near:     Physical Exam Constitutional:      Appearance: Normal appearance.  HENT:     Head: Normocephalic and atraumatic.     Mouth/Throat:     Dentition: Abnormal dentition. Dental tenderness, gingival swelling, dental caries and dental abscesses present. No gum lesions.     Tongue: No lesions. Tongue does not deviate from midline.   Cardiovascular:     Rate and Rhythm: Normal rate and regular rhythm.     Heart sounds: Normal heart sounds.  Pulmonary:     Effort:  Pulmonary effort is normal.     Breath sounds: Normal breath sounds.  Skin:    General: Skin is warm and dry.  Neurological:     General: No focal deficit present.     Mental Status: He is alert and oriented to person, place, and time.  Psychiatric:        Mood and Affect: Mood normal.        Behavior: Behavior normal.     UC Treatments / Results  Labs (all labs ordered are listed, but only abnormal results are displayed) Labs Reviewed - No data to display  EKG   Radiology No results found.  Procedures Procedures (including critical care time)  Medications Ordered in UC Medications - No data to display  Initial Impression / Assessment and Plan / UC Course  I have reviewed the triage vital signs and the nursing notes.  Pertinent labs & imaging results that were available during my care of the patient were reviewed by me and considered in my medical decision making (see chart for details).     Patient has multiple dental caries and several teeth missing.  Gingival swelling and erythema noted at  left lower gumline.  Patient has prescribed penicillin for 14 days and strongly encouraged to follow-up with a dentist for his cracked tooth.  Patient verbalized understanding. Final Clinical Impressions(s) / UC Diagnoses   Final diagnoses:  Closed broken tooth with complication, initial encounter  Dental abscess     Discharge Instructions      Please begin taking penicillin 1 capsule 3 times daily for the next 14 days.  Please keep in mind that this will not heal your tooth but only treat the infection that is within your tooth.  It is important that you follow-up with a dentist to have your tooth repaired so that the infection does not return.     ED Prescriptions     Medication Sig Dispense Auth. Provider   penicillin v potassium (VEETID) 500 MG tablet Take 1 tablet (500 mg total) by mouth 3 (three) times daily for 14 days. 42 tablet Theadora Rama Scales, PA-C      PDMP not reviewed this encounter.   Theadora Rama Scales, [place] 02/09/21 1947

## 2021-02-09 NOTE — Discharge Instructions (Addendum)
Please begin taking penicillin 1 capsule 3 times daily for the next 14 days.  Please keep in mind that this will not heal your tooth but only treat the infection that is within your tooth.  It is important that you follow-up with a dentist to have your tooth repaired so that the infection does not return.

## 2021-02-09 NOTE — ED Triage Notes (Signed)
Pt reports had broken tooth on left posterior lower side. Reports swelling and pain that increased over two days.

## 2021-10-16 ENCOUNTER — Ambulatory Visit (HOSPITAL_COMMUNITY)
Admission: EM | Admit: 2021-10-16 | Discharge: 2021-10-16 | Disposition: A | Discharge status: Home / Self Care | Payer: Self-pay | Attending: Student | Admitting: Student

## 2021-10-16 ENCOUNTER — Encounter (HOSPITAL_COMMUNITY): Payer: Self-pay

## 2021-10-16 ENCOUNTER — Ambulatory Visit (INDEPENDENT_AMBULATORY_CARE_PROVIDER_SITE_OTHER): Payer: Self-pay

## 2021-10-16 DIAGNOSIS — S82142A Displaced bicondylar fracture of left tibia, initial encounter for closed fracture: Secondary | ICD-10-CM

## 2021-10-16 DIAGNOSIS — M25562 Pain in left knee: Secondary | ICD-10-CM
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
## 2021-10-16 NOTE — Discharge Instructions (Addendum)
-  Use the knee brace and crutches while pain persists  ?-Follow-up with orthopedist for recheck and CT scan - information below, or you can call your orthopedist from 2021. ?-If you can't follow-up with orthopedist, you can alternatively head to the ED for CT scan ?-If your head pain gets worse - severe headache, dizziness, weakness - head to the ED or call 911 ? ?

## 2022-02-12 ENCOUNTER — Other Ambulatory Visit: Payer: Self-pay

## 2022-02-12 ENCOUNTER — Emergency Department (HOSPITAL_COMMUNITY)
Admission: EM | Admit: 2022-02-12 | Discharge: 2022-02-12 | Disposition: A | Discharge status: Home / Self Care | Payer: Self-pay | Attending: Emergency Medicine | Admitting: Emergency Medicine

## 2022-02-12 ENCOUNTER — Emergency Department (HOSPITAL_COMMUNITY): Payer: Self-pay

## 2022-02-12 ENCOUNTER — Encounter (HOSPITAL_COMMUNITY): Payer: Self-pay

## 2022-02-12 DIAGNOSIS — S7012XA Contusion of left thigh, initial encounter: Secondary | ICD-10-CM | POA: Insufficient documentation

## 2022-02-12 MED ORDER — METHOCARBAMOL 500 MG PO TABS: 500.0000 mg | ORAL_TABLET | Freq: Three times a day (TID) | ORAL | 0 refills | Status: AC | PRN
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
## 2022-02-12 NOTE — ED Notes (Signed)
Called Pt 5X pt did not answer at this time. Pt left hospital.
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
## 2023-04-21 ENCOUNTER — Emergency Department (HOSPITAL_COMMUNITY)
Admission: EM | Admit: 2023-04-21 | Discharge: 2023-04-21 | Disposition: A | Discharge status: Home / Self Care | Payer: Medicaid Other | Attending: Emergency Medicine | Admitting: Emergency Medicine

## 2023-04-21 ENCOUNTER — Emergency Department (HOSPITAL_COMMUNITY): Payer: Medicaid Other

## 2023-04-21 ENCOUNTER — Other Ambulatory Visit: Payer: Self-pay

## 2023-04-21 ENCOUNTER — Encounter (HOSPITAL_COMMUNITY): Payer: Self-pay

## 2023-04-21 DIAGNOSIS — R1110 Vomiting, unspecified: Secondary | ICD-10-CM | POA: Insufficient documentation

## 2023-04-21 DIAGNOSIS — Z1152 Encounter for screening for COVID-19: Secondary | ICD-10-CM | POA: Insufficient documentation

## 2023-04-21 DIAGNOSIS — J40 Bronchitis, not specified as acute or chronic: Secondary | ICD-10-CM | POA: Insufficient documentation

## 2023-04-21 LAB — RESP PANEL BY RT-PCR (RSV, FLU A&B, COVID)  RVPGX2
Influenza A by PCR: NEGATIVE
Influenza B by PCR: NEGATIVE
Resp Syncytial Virus by PCR: NEGATIVE
SARS Coronavirus 2 by RT PCR: NEGATIVE

## 2023-04-21 MED ORDER — BENZONATATE 100 MG PO CAPS: 100.0000 mg | ORAL_CAPSULE | Freq: Three times a day (TID) | ORAL | 0 refills | Status: AC | PRN

## 2023-04-21 MED ORDER — ALBUTEROL SULFATE HFA 108 (90 BASE) MCG/ACT IN AERS
1.0000 | INHALATION_SPRAY | Freq: Once | RESPIRATORY_TRACT | Status: AC
  Administered 2023-04-21: 2 via RESPIRATORY_TRACT
  Filled 2023-04-21: qty 6.7

## 2023-04-21 MED ORDER — AEROCHAMBER PLUS FLO-VU LARGE MISC
1.0000 | Freq: Once | Status: AC
  Administered 2023-04-21: 1

## 2023-04-21 MED ORDER — PREDNISONE 20 MG PO TABS: 40.0000 mg | ORAL_TABLET | Freq: Every day | ORAL | 0 refills | Status: DC

## 2023-04-21 MED ORDER — BENZONATATE 100 MG PO CAPS: 100.0000 mg | ORAL_CAPSULE | Freq: Three times a day (TID) | ORAL | 0 refills | Status: DC | PRN

## 2023-04-21 MED ORDER — PREDNISONE 20 MG PO TABS: 40.0000 mg | ORAL_TABLET | Freq: Every day | ORAL | 0 refills | Status: AC

## 2023-04-21 MED ORDER — IPRATROPIUM-ALBUTEROL 0.5-2.5 (3) MG/3ML IN SOLN
3.0000 mL | Freq: Once | RESPIRATORY_TRACT | Status: AC
  Administered 2023-04-21: 3 mL via RESPIRATORY_TRACT
  Filled 2023-04-21: qty 3

## 2023-04-21 MED ORDER — PREDNISONE 20 MG PO TABS
40.0000 mg | ORAL_TABLET | Freq: Once | ORAL | Status: AC
  Administered 2023-04-21: 40 mg via ORAL
  Filled 2023-04-21: qty 2

## 2023-04-21 NOTE — Discharge Instructions (Addendum)
You were seen in the ER today for evaluation of a cough and cold symptoms.  I likely think that you have bronchitis from a viral illness.  For this, please make sure you are using your albuterol inhaler as needed for coughing.  Have also prescribed you some prednisone which will help with inflammation in your lungs.  Please make sure you take as directed.  I have also prescribed you some Tessalon Perles that can help with your coughing.  You can also try over-the-counter Robitussin instead.  Please make sure you follow with your primary care doctor within the next few days for reevaluation to make sure the this is not turning into pneumonia.  If you have any concerns, new or worsening symptoms, please return to your nearest emergency department for reevaluation.  Contact a doctor if: Your symptoms do not get better in 2 weeks. You have trouble coughing up the mucus. Your cough keeps you awake at night. You have a fever. Get help right away if: You cough up blood. You have chest pain. You have very bad shortness of breath. You faint or keep feeling like you are going to faint. You have a very bad headache. Your fever or chills get worse. These symptoms may be an emergency. Get help right away. Call your local emergency services (911 in the [place] Do not wait to see if the symptoms will go away. Do not drive yourself to the hospital.

## 2023-04-21 NOTE — ED Triage Notes (Signed)
Pt came to ed for n/v/d for 2 days.  C/O coughing and sneezing. Axox4.

## 2023-04-21 NOTE — ED Notes (Signed)
Patient verbalizes understanding of discharge instructions. Opportunity for questioning and answers were provided. Armband removed by staff, pt discharged from ED. Pt ambulatory to ED waiting room with steady gait.  

## 2023-04-21 NOTE — ED Notes (Signed)
Pt ambulatory with pulse ox in hallway with steady gait. O2 94% during ambulation, denies shob during ambulation.

## 2023-04-21 NOTE — ED Provider Notes (Signed)
[place]ERGENCY DEPARTMENT AT [facility] Provider Note   CSN: [id number] Arrival date & time: 04/21/23  1350     History Chief Complaint  Patient presents with   Nausea   Diarrhea    Marcus Chapman is a 47 y.o. male reportedly otherwise healthy presents the emerged from today for evaluation of cough and cold symptoms.  Patient reports the symptoms started on Friday.  He complaining of an occasional productive cough with runny nose and nasal congestion.  Denies any sore throat, fever, chills, Donnell pain, dysuria, hematuria, melena, hematochezia.  Reports that he was having some vomiting but only after coughing.  This was 3 episodes.  Reports he did have some soft stool yesterday after eating [employer] but has not had any today.  Denies any abdominal pain. Denies any chest pain or SOB. Denies any hemoptysis. Reports MJ use. Denies EtOH use.    Diarrhea Associated symptoms: vomiting (post-tussive)   Associated symptoms: no abdominal pain, no chills and no fever        Home Medications Prior to Admission medications   Medication Sig Start Date End Date Taking? Authorizing Provider  methocarbamol (ROBAXIN) 500 MG tablet Take 1 tablet (500 mg total) by mouth every 8 (eight) hours as needed for muscle spasms. 02/12/22   Benjiman Core, MD      Allergies    Patient has no known allergies.    Review of Systems   Review of Systems  Constitutional:  Negative for chills and fever.  HENT:  Positive for congestion and rhinorrhea.   Respiratory:  Positive for cough. Negative for shortness of breath.   Cardiovascular:  Negative for chest pain, palpitations and leg swelling.  Gastrointestinal:  Positive for diarrhea (soft, not watery. Has since subsided) and vomiting (post-tussive). Negative for abdominal pain, constipation and nausea.    Physical Exam Updated Vital Signs BP (!) 118/92   Pulse 89   Temp 98.5 F (36.9 C)   Resp 17   Ht 6\' 2"  (1.88 m)   Wt 99.8 kg    SpO2 98%   BMI 28.25 kg/m  Physical Exam Vitals and nursing note reviewed.  Constitutional:      General: He is not in acute distress.    Appearance: He is not ill-appearing or toxic-appearing.  Eyes:     General: No scleral icterus. Cardiovascular:     Rate and Rhythm: Normal rate.  Pulmonary:     Effort: Pulmonary effort is normal. No respiratory distress.     Comments: Coarse wheezing heard in bilateral lower bases and expiratory phase.  No inspiratory wheezing heard.  Patient speaking in full sentences with ease satting well on room air without increased work of breathing. Abdominal:     Palpations: Abdomen is soft.     Tenderness: There is no abdominal tenderness. There is no guarding or rebound.  Skin:    General: Skin is warm and dry.  Neurological:     Mental Status: He is alert.     ED Results / Procedures / Treatments   Labs (all labs ordered are listed, but only abnormal results are displayed) Labs Reviewed  RESP PANEL BY RT-PCR (RSV, FLU A&B, COVID)  [id number]    EKG None  Radiology DG Chest 2 View  Result Date: 04/21/2023 CLINICAL DATA:  Cough. EXAM: CHEST - 2 VIEW COMPARISON:  None Available. FINDINGS: Cardiomediastinal silhouette is normal. Mediastinal contours appear intact. There is no evidence of focal airspace consolidation, pleural effusion or pneumothorax. Mild diffuse  peribronchial thickening with central predominance. Osseous structures are without acute abnormality. Soft tissues are grossly normal. IMPRESSION: Mild diffuse peribronchial thickening with central predominance, which may represent bronchitis or reactive airway disease. Electronically Signed   By: Ted Mcalpine M.D.   On: 04/21/2023 14:36    Procedures Procedures   Medications Ordered in ED Medications  ipratropium-albuterol (DUONEB) 0.5-2.5 (3) MG/3ML nebulizer solution 3 mL (3 mLs Nebulization Given 04/21/23 1636)    ED Course/ Medical Decision Making/ A&P                               Medical Decision Making Amount and/or Complexity of Data Reviewed Radiology: ordered.  Risk Prescription drug management.   47 y.o. male presents to the ER for evaluation of cough. Differential diagnosis includes but is not limited to Upper respiratory infection, lower respiratory infection, allergies, asthma, irritants, foreign body, medications (ACE inhibitors), reflux, CHF, lung cancer, interstitial lung disease, psychiatric causes, postnasal drip. Vital signs BP 118/92, otherwise unremarkable. Physical exam as noted above.   I independently reviewed and interpreted the patient's labs.  COVID,  Flu, RSV negative.  CXR shows Mild diffuse peribronchial thickening with central predominance, which may represent bronchitis or reactive airway disease. Per radiologist's interpretation.    The patient was ordered duoneb with the coarse wheezing.   On re-evaluation, the patient resting comfortably on stretcher. Reports that he feels better after the breathing treatment.  Is asking for a work note.  Work note provided.  On reevaluation of the lung sounds, his coarse wheezing has improved some.  There is minimal expiratory wheeze auscultated and he still speaking in full sentences, satting well room air without increased work of breathing.  On ambulation, patient maintained sats at the lowest of 94%.  Did not complain about any shortness of breath at that time as well.  The patient was initially documented with a pulse ox of 88%, I think this is likely user error.  His pulse ox was immediately checked when he arrived into the room and it was 98% on room air and he did not appear in any acute distress.  He has maintained good oxygenation while in the room as well.  He is not complaining about any chest pain or shortness of breath.  Will discharge him home on prednisone, Tessalon Perles.  He is not ill-appearing.  Speaking of full sentences.  X-ray does not show any signs of pneumonia.  He is  not complaining about any chest pain or shortness of breath.  This is likely bronchitis from viral illness.  Recommended follow-up with PCP.  He does not appear in any acute distress and is stable for discharge home with close outpatient follow-up and return precautions.  We discussed the results of the labs/imaging. The plan is albuterol, prednisone, follow up with PCP. We discussed strict return precautions and red flag symptoms. The patient verbalized their understanding and agrees to the plan. The patient is stable and being discharged home in good condition.  Portions of this report may have been transcribed using voice recognition software. Every effort was made to ensure accuracy; however, inadvertent computerized transcription errors may be present.   Final Clinical Impression(s) / ED Diagnoses Final diagnoses:  Bronchitis  Post-tussive emesis    Rx / DC Orders ED Discharge Orders          Ordered    predniSONE (DELTASONE) 20 MG tablet  Daily,   Status:  Discontinued        04/21/23 1856    benzonatate (TESSALON) 100 MG capsule  Every 8 hours PRN,   Status:  Discontinued        04/21/23 1856    benzonatate (TESSALON) 100 MG capsule  Every 8 hours PRN        04/21/23 1856    predniSONE (DELTASONE) 20 MG tablet  Daily        04/21/23 1856              Achille Rich, PA-C 04/21/23 1918    Vanetta Mulders, MD 04/27/23 [phone]
